# Patient Record
Sex: Male | Born: 2000 | Race: White | Hispanic: Yes | Marital: Single | Smoking: Never smoker
Health system: Southern US, Community
[De-identification: ages and names within clinical notes are randomized; demographics above are authoritative.]

## PROBLEM LIST (undated history)

## (undated) DIAGNOSIS — J45909 Unspecified asthma, uncomplicated: Secondary | ICD-10-CM

---
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
## 2020-03-12 ENCOUNTER — Ambulatory Visit: Payer: Medicaid Other | Attending: Internal Medicine

## 2020-03-12 DIAGNOSIS — Z23 Encounter for immunization: Secondary | ICD-10-CM

## 2020-03-12 NOTE — Progress Notes (Signed)
   Covid-19 Vaccination Clinic  Name:  James Fischer    MRN: [id number] DOB: 2001-07-23  03/12/2020  Mr. James Fischer was observed post Covid-19 immunization for 15 minutes without incident. He was provided with Vaccine Information Sheet and instruction to access the V-Safe system.   Mr. James Fischer was instructed to call 911 with any severe reactions post vaccine: Marland Kitchen Difficulty breathing  . Swelling of face and throat  . A fast heartbeat  . A bad rash all over body  . Dizziness and weakness   Immunizations Administered    Name Date Dose VIS Date Route   Pfizer COVID-19 Vaccine 03/12/2020  3:24 PM 0.3 mL 11/27/2018 Intramuscular   Manufacturer: [employer], [employer]   Lot: [id number]   NDC: [address]-1

## 2020-04-02 ENCOUNTER — Ambulatory Visit: Payer: Medicaid Other | Attending: Internal Medicine

## 2020-04-02 DIAGNOSIS — Z23 Encounter for immunization: Secondary | ICD-10-CM

## 2020-04-02 NOTE — Progress Notes (Signed)
   Covid-19 Vaccination Clinic  Name:  Chaske Paskett    MRN: [id number] DOB: Dec 25, 2000  04/02/2020  Mr. Henriquez-Garcia was observed post Covid-19 immunization for 15 minutes without incident. He was provided with Vaccine Information Sheet and instruction to access the V-Safe system.   Mr. Velna Hatchet was instructed to call 911 with any severe reactions post vaccine: Marland Kitchen Difficulty breathing  . Swelling of face and throat  . A fast heartbeat  . A bad rash all over body  . Dizziness and weakness   Immunizations Administered    Name Date Dose VIS Date Route   Pfizer COVID-19 Vaccine 04/02/2020  3:21 PM 0.3 mL 11/27/2018 Intramuscular   Manufacturer: [employer], [employer]   Lot: [id number]   NDC: [address]-9

## 2022-10-28 ENCOUNTER — Encounter: Payer: Self-pay | Admitting: Family Medicine

## 2022-10-28 ENCOUNTER — Ambulatory Visit (INDEPENDENT_AMBULATORY_CARE_PROVIDER_SITE_OTHER): Payer: Medicaid Other | Admitting: Family Medicine

## 2022-10-28 VITALS — BP 101/63 | HR 63 | Temp 97.0°F | Resp 16 | Ht 67.0 in | Wt 124.6 lb

## 2022-10-28 DIAGNOSIS — Z7689 Persons encountering health services in other specified circumstances: Secondary | ICD-10-CM

## 2022-10-28 DIAGNOSIS — Z789 Other specified health status: Secondary | ICD-10-CM | POA: Diagnosis not present

## 2022-10-28 NOTE — Progress Notes (Unsigned)
Patient is here to established care with provider today. Patient has many health concern they would like to discuss with provider today  Care gaps discuss at appointment today  

## 2022-10-28 NOTE — Progress Notes (Unsigned)
New Patient Office Visit  Subjective    Patient ID: James Fischer, male    DOB: 2000/11/08  Age: 22 y.o. MRN: [id number]  CC:  Chief Complaint  Patient presents with   Establish Care    HPI James Fischer presents to establish care and for complaint of speech deficits. He understands language but is not able to process. He did have therapies when he was younger but is unaware of any specific diagnosis.    Outpatient Encounter Medications as of 10/28/2022  Medication Sig   diphenhydrAMINE (BENADRYL) 25 MG tablet Take 2 tablets (50 mg total) by mouth every 6 (six) hours as needed for up to 3 days for itching.   fluocinonide (LIDEX) 0.05 % external solution Apply to itch, scaly areas on the scalp. Not to the face (Patient not taking: Reported on 10/28/2022)   hydrocortisone 1 % ointment Apply 1 application topically 2 (two) times daily. Apply to rash on neck. Do NOT use on face or genitals. (Patient not taking: Reported on 10/28/2022)   hydrocortisone cream 1 % Apply to affected area 2 times daily, apply to body. DO NOT use on face or genitals (Patient not taking: Reported on 10/28/2022)   ibuprofen (ADVIL,MOTRIN) 100 MG/5ML suspension Take 20 mLs (400 mg total) by mouth every 6 (six) hours as needed. (Patient not taking: Reported on 10/28/2022)   KETOCONAZOLE, TOPICAL, 1 % SHAM Apply 5 mLs topically daily. (Patient not taking: Reported on 10/28/2022)   ondansetron (ZOFRAN ODT) 4 MG disintegrating tablet Take 1 tablet (4 mg total) by mouth every 8 (eight) hours as needed for nausea or vomiting. (Patient not taking: Reported on 10/28/2022)   ondansetron (ZOFRAN) 4 MG tablet Take 1 tablet (4 mg total) by mouth every 6 (six) hours. (Patient not taking: Reported on 10/28/2022)   oseltamivir (TAMIFLU) 75 MG capsule Take 1 capsule (75 mg total) by mouth every 12 (twelve) hours. (Patient not taking: Reported on 10/28/2022)   No facility-administered encounter medications on file as of  10/28/2022.    Past Medical History:  Diagnosis Date   Asthma     No past surgical history on file.  No family history on file.  Social History   Socioeconomic History   Marital status: Single    Spouse name: Not on file   Number of children: Not on file   Years of education: Not on file   Highest education level: Not on file  Occupational History   Not on file  Tobacco Use   Smoking status: Never   Smokeless tobacco: Not on file  Substance and Sexual Activity   Alcohol use: Not on file   Drug use: Not on file   Sexual activity: Not on file  Other Topics Concern   Not on file  Social History Narrative   Not on file   Social Determinants of Health   Financial Resource Strain: Not on file  Food Insecurity: Not on file  Transportation Needs: Not on file  Physical Activity: Not on file  Stress: Not on file  Social Connections: Not on file  Intimate Partner Violence: Not on file    ROS      Objective    BP 101/63   Pulse 63   Temp (!) 97 F (36.1 C) (Temporal)   Resp 16   Ht 5\' 7"  (1.702 m)   Wt 124 lb 9.6 oz (56.5 kg)   SpO2 96%   BMI 19.52 kg/m   Physical Exam  {Labs (  Optional):23779}    Assessment & Plan:   Problem List Items Addressed This Visit   None Visit Diagnoses     Encounter to establish care    -  Primary   Language barrier to communication           No follow-ups on file.   Becky Sax, MD

## 2022-10-31 ENCOUNTER — Encounter: Payer: Self-pay | Admitting: Family Medicine

## 2022-11-08 ENCOUNTER — Ambulatory Visit: Payer: Medicaid Other | Attending: Family Medicine | Admitting: Speech Pathology

## 2022-11-08 ENCOUNTER — Encounter: Payer: Self-pay | Admitting: Speech Pathology

## 2022-11-08 DIAGNOSIS — Z7689 Persons encountering health services in other specified circumstances: Secondary | ICD-10-CM | POA: Diagnosis not present

## 2022-11-08 DIAGNOSIS — R4789 Other speech disturbances: Secondary | ICD-10-CM | POA: Diagnosis present

## 2022-11-08 NOTE — Therapy (Signed)
OUTPATIENT SPEECH LANGUAGE PATHOLOGY EVALUATION   Patient Name: James Fischer MRN: [id number] DOB:04-13-2001, 22 y.o., male Today's Date: 11/08/2022  PCP: Dorna Mai, MD REFERRING PROVIDER: Dorna Mai, MD  END OF SESSION:   Past Medical History:  Diagnosis Date   Asthma    History reviewed. No pertinent surgical history. Patient Active Problem List   Diagnosis Date Noted   Eczema 03/20/2013    ONSET DATE: Reportedly - 73 years of age is when mother noticed a delay  REFERRING DIAG: Z76.89 (ICD-10-CM) - Need for speech therapy assessment   THERAPY DIAG:  Cognitive communication deficit  Rationale for Evaluation and Treatment: Rehabilitation  SUBJECTIVE:   SUBJECTIVE STATEMENT: Pt was unable to provide much information independently. Required mother to assist with providing history.  Pt accompanied by: self and mother  and interpretor.   PERTINENT HISTORY: Unknown  PAIN:  Are you having pain? No  FALLS: Has patient fallen in last 6 months?  No  LIVING ENVIRONMENT: Lives with: lives with their family Lives in: House/apartment  PLOF:  Level of assistance: Independent with ADLs Employment: Other: Pt was working and was laid off because it is hard for him to understand. He had 3 jobs - but was laid off.   PATIENT GOALS: getting a job  OBJECTIVE:   DIAGNOSTIC FINDINGS: NA  COGNITION: Overall cognitive status: History of cognitive impairments - at baseline  COGNITIVE COMMUNICATION: Following directions: Follows multi-step commands inconsistently  Auditory comprehension: Impaired: Could not answer [place]- questions. Had difficulty answering simple biographical questions.  Verbal expression: Impaired: Interpretor reported difficulty understanding pt meaning.  Functional communication: Impaired: Mother reports pt difficulty keeping a job due to what she believes is due to difficulty understanding. Pt could not answer basic questions (other than  yes/no) without mod-max verbal prompting.    STANDARDIZED ASSESSMENTS: Portions of CLQT  -Basic questions: 8/9 required extra time and prompting to answer biographical questions -Story retell: Could not recall any information from story Informal assessment: Could not answer any [facility]- questions about self. SLP discontinued assessment as she suspects this is baseline for patient based off mother report.   PATIENT REPORTED OUTCOME MEASURES (PROM): NA    PATIENT EDUCATION: Education details: Resources Person educated: Patient and Parent Education method: Explanation Education comprehension: needs further education   GOALS: Goals reviewed with patient? No  SHORT TERM GOALS: Target date: 12/07/22  Pt mother will demonstrate understanding of therapist recommendations and resources for patient. Baseline: Goal status: INITIAL    ASSESSMENT:  CLINICAL IMPRESSION: Pt is a 22 yo male who presents to [facility] by referral from his PCP due to difficulty understanding. Pt was brought by his mother, whom he lives with. Reportedly, pt has had 3 jobs in the past but is unable to hold any jobs. Mother isn't quite sure why and pt was unable to tell "why"; however, mother suspects it is due to his inability to comprehend information. SLP completed clinical interview with mother. Mother reported pt participated in special education through high school and has completed several therapies. Mother was unable to provide SLP with a specific diagnosis; however, she did report he did not speak until he was 42-36 years of age (and only single words). She reported when he became school-aged, he was speaking in sentences. Pt is able to text in Spanish. The interpreter read text messages to mother aloud and reported some made sense and some did not. Pt was unable to provide any information independently. SLP observed difficulties with speech and  language during informal assessment. Pt was unable to comprehend or retain  information. He also was unable to verbally answer [facility] questions and appeared to be visibly stressed when trying to communicate. Pt was able to answer yes/no questions, though all of his answers were "no". SLP is limited in treating patient due to limited history (congenital vs. Acquired, hx of speech-language impairment etc.) Mother was unable to provide information from IEP.  SLP to see family for additional session to provide mother with resource support for intellectual disabilities ([facility]) for vocational and social support. SLP will also reach out to [facility] to see if they would be able to treat patient, as well as doctor for possible audiology referral to r/o auditory processing disorder.     OBJECTIVE IMPAIRMENTS: include  communication . These impairments are limiting patient from effectively communicating at home and in community. Factors affecting potential to achieve goals and functional outcome are previous level of function and severity of impairments.. Patient will benefit from skilled SLP services to address above impairments and improve overall function.  REHAB POTENTIAL: Poor Unable to determine nature of impairment. Limited resources.   PLAN:  SLP FREQUENCY:  1 additional visit.  SLP DURATION:  1 sessions  PLANNED INTERVENTIONS: Patient/family education    [place], [place] 11/08/2022, 12:35 PM

## 2022-11-15 NOTE — Addendum Note (Signed)
Addended by: Royston Sinner L on: 11/15/2022 09:00 AM   Modules accepted: Orders

## 2022-11-21 ENCOUNTER — Ambulatory Visit: Payer: Medicaid Other | Admitting: Speech Pathology

## 2022-11-21 DIAGNOSIS — R4789 Other speech disturbances: Secondary | ICD-10-CM | POA: Diagnosis not present

## 2022-11-22 NOTE — Therapy (Signed)
OUTPATIENT SPEECH LANGUAGE PATHOLOGY TREATMENT NOTE   Patient Name: James Fischer MRN: [id number] DOB:06/05/2001, 22 y.o., male Today's Date: 11/22/2022  PCP: Dorna Mai, MD REFERRING PROVIDER: Dorna Mai, MD  SPEECH THERAPY DISCHARGE SUMMARY  Visits from Start of Care: 2  Current functional level related to goals / functional outcomes: Met   Remaining deficits: Baseline impairments; were not addressed.    Education / Equipment: Completed and resources provided.    Patient agrees to discharge. Patient goals were met. Patient is being discharged due to meeting the stated rehab goals..     END OF SESSION:   End of Session - 11/22/22 1208     Visit Number 2    Number of Visits 2    SLP Start Time 1230    SLP Stop Time  [id number]    SLP Time Calculation (min) 25 min    Activity Tolerance Patient tolerated treatment well             Past Medical History:  Diagnosis Date   Asthma    No past surgical history on file. Patient Active Problem List   Diagnosis Date Noted   Eczema 03/20/2013    ONSET DATE: Reportedly - 62 years of age is when mother noticed a delay   REFERRING DIAG: Z76.89 (ICD-10-CM) - Need for speech therapy assessment   THERAPY DIAG:  Other speech disturbance  Rationale for Evaluation and Treatment: Habilitation  SUBJECTIVE:   SUBJECTIVE STATEMENT: "I don't know."  PAIN:  Are you having pain? No     OBJECTIVE:   TODAY'S TREATMENT:                                                                                                                                         DATE:  11/21/22: Patient and mom were seen for education session and discharge.  SLP reviewed discussion from evaluation regarding patient's impairments.  SLP provided the following recommendations re:  -Going to patient's previous school to inquire about his special education area of eligibility -SLP provided mother with script to provide office in [place]  and in [place] - [facility]   -Provided contact information  -Vocational Rehabilitation through the state of [place]  -Provided contact information -Interpretor recommended "[employer]" for immigrants/spanish speaking individuals who need assistance navigating situations  Mom reported understanding.  SLP encouraged mom to reach back out with any other questions.  SLP unable to make any other recommendations at this time, as patient mother does not know patient's original diagnosis and pt is at baseline.    SLP to discharge.   PATIENT EDUCATION: Education details: Resources Person educated: Patient and Parent Education method: Explanation Education comprehension: needs further education     GOALS: Goals reviewed with patient? No   SHORT TERM GOALS: Target date: 12/07/22   Pt mother will demonstrate understanding of therapist recommendations and resources for patient. Baseline:  Goal status: MET     ASSESSMENT:   CLINICAL IMPRESSION: Pt is a 22 yo male who presents [facility] by referral from his PCP due to difficulty understanding. Pt was brought by his mother, whom he lives with. Reportedly, pt has had 3 jobs in the past but is unable to hold any jobs. Mother isn't quite sure why and pt was unable to tell "why"; however, mother suspects it is due to his inability to comprehend information. SLP completed clinical interview with mother. Mother reported pt participated in special education through high school and has completed several therapies. Mother was unable to provide SLP with a specific diagnosis; however, she did report he did not speak until he was 35-36 years of age (and only single words). She reported when he became school-aged, he was speaking in sentences. Pt is able to text in Spanish. The interpreter read text messages to mother aloud and reported some made sense and some did not. Pt was unable to provide any information independently. SLP observed difficulties  with speech and language during informal assessment. Pt was unable to comprehend or retain information. He also was unable to verbally answer [facility] questions and appeared to be visibly stressed when trying to communicate. Pt was able to answer yes/no questions, though all of his answers were "no". SLP is limited in treating patient due to limited history (congenital vs. Acquired, hx of speech-language impairment etc.) Mother was unable to provide information from IEP.  SLP to see family for additional session to provide mother with resource support for intellectual disabilities ([facility] [place]) for vocational and social support. SLP will also reach out to [facility] to see if they would be able to treat patient, as well as doctor for possible audiology referral to r/o auditory processing disorder.       OBJECTIVE IMPAIRMENTS: include  communication . These impairments are limiting patient from effectively communicating at home and in community. Factors affecting potential to achieve goals and functional outcome are previous level of function and severity of impairments.. Patient will benefit from skilled SLP services to address above impairments and improve overall function.   REHAB POTENTIAL: Poor Unable to determine nature of impairment. Limited resources.    PLAN:   SLP FREQUENCY:  1 additional visit.   SLP DURATION:  1 sessions   PLANNED INTERVENTIONS: Patient/family education   [place], [place] 11/22/2022, 12:09 PM

## 2023-04-13 ENCOUNTER — Ambulatory Visit (INDEPENDENT_AMBULATORY_CARE_PROVIDER_SITE_OTHER): Payer: Medicaid Other | Admitting: Family Medicine

## 2023-04-13 ENCOUNTER — Encounter: Payer: Self-pay | Admitting: Family Medicine

## 2023-04-13 VITALS — BP 101/59 | HR 65 | Temp 98.0°F | Ht 67.5 in | Wt 125.8 lb

## 2023-04-13 DIAGNOSIS — R109 Unspecified abdominal pain: Secondary | ICD-10-CM

## 2023-04-13 DIAGNOSIS — Z789 Other specified health status: Secondary | ICD-10-CM

## 2023-04-13 MED ORDER — OMEPRAZOLE 40 MG PO CPDR: 40.0000 mg | DELAYED_RELEASE_CAPSULE | Freq: Every day | ORAL | 2 refills | Status: DC

## 2023-04-13 NOTE — Progress Notes (Signed)
Established Patient Office Visit  Subjective    Patient ID: James Fischer, male    DOB: 02-26-2001  Age: 22 y.o. MRN: [id number]  CC:  Chief Complaint  Patient presents with   Abdominal Pain    HPI James Fischer presents with mom with complaint of abdominal pain and using the bathroom a lot. He has a lot of gas. History was vague. This visit was aided by an interpreter.    Outpatient Encounter Medications as of 04/13/2023  Medication Sig   diphenhydrAMINE (BENADRYL) 25 MG tablet Take 2 tablets (50 mg total) by mouth every 6 (six) hours as needed for up to 3 days for itching.   fluocinonide (LIDEX) 0.05 % external solution Apply to itch, scaly areas on the scalp. Not to the face (Patient not taking: Reported on 10/28/2022)   hydrocortisone 1 % ointment Apply 1 application topically 2 (two) times daily. Apply to rash on neck. Do NOT use on face or genitals. (Patient not taking: Reported on 10/28/2022)   hydrocortisone cream 1 % Apply to affected area 2 times daily, apply to body. DO NOT use on face or genitals (Patient not taking: Reported on 10/28/2022)   ibuprofen (ADVIL,MOTRIN) 100 MG/5ML suspension Take 20 mLs (400 mg total) by mouth every 6 (six) hours as needed. (Patient not taking: Reported on 10/28/2022)   KETOCONAZOLE, TOPICAL, 1 % SHAM Apply 5 mLs topically daily. (Patient not taking: Reported on 10/28/2022)   ondansetron (ZOFRAN ODT) 4 MG disintegrating tablet Take 1 tablet (4 mg total) by mouth every 8 (eight) hours as needed for nausea or vomiting. (Patient not taking: Reported on 10/28/2022)   ondansetron (ZOFRAN) 4 MG tablet Take 1 tablet (4 mg total) by mouth every 6 (six) hours. (Patient not taking: Reported on 10/28/2022)   oseltamivir (TAMIFLU) 75 MG capsule Take 1 capsule (75 mg total) by mouth every 12 (twelve) hours. (Patient not taking: Reported on 10/28/2022)   No facility-administered encounter medications on file as of 04/13/2023.    Past Medical  History:  Diagnosis Date   Asthma     No past surgical history on file.  No family history on file.  Social History   Socioeconomic History   Marital status: Single    Spouse name: Not on file   Number of children: Not on file   Years of education: Not on file   Highest education level: Not on file  Occupational History   Not on file  Tobacco Use   Smoking status: Never   Smokeless tobacco: Not on file  Substance and Sexual Activity   Alcohol use: Not on file   Drug use: Not on file   Sexual activity: Not on file  Other Topics Concern   Not on file  Social History Narrative   Not on file   Social Determinants of Health   Financial Resource Strain: Not on file  Food Insecurity: Not on file  Transportation Needs: Not on file  Physical Activity: Not on file  Stress: Not on file  Social Connections: Not on file  Intimate Partner Violence: Not on file    Review of Systems  All other systems reviewed and are negative.       Objective    BP (!) 101/59   Pulse 65   Temp 98 F (36.7 C) (Oral)   Ht 5' 7.5" (1.715 m)   Wt 125 lb 12.8 oz (57.1 kg)   SpO2 96%   BMI 19.41 kg/m   Physical  Exam Vitals and nursing note reviewed.  Constitutional:      General: He is not in acute distress. Cardiovascular:     Rate and Rhythm: Normal rate and regular rhythm.  Pulmonary:     Effort: Pulmonary effort is normal.     Breath sounds: Normal breath sounds.  Neurological:     General: No focal deficit present.     Mental Status: He is alert.  Psychiatric:        Mood and Affect: Mood normal.        Behavior: Behavior normal.         Assessment & Plan:   1. Abdominal pain, unspecified abdominal location Omperazole prescribed.   2. Language barrier to communication     No follow-ups on file.   James Raymond, MD

## 2023-04-13 NOTE — Progress Notes (Signed)
Pt mom states Pt suffers from stomach. Has pain. Doesn't eat much but is using the restroom a lot.

## 2023-05-18 ENCOUNTER — Ambulatory Visit (INDEPENDENT_AMBULATORY_CARE_PROVIDER_SITE_OTHER): Payer: Medicaid Other | Admitting: Family Medicine

## 2023-05-18 ENCOUNTER — Encounter: Payer: Self-pay | Admitting: Family Medicine

## 2023-05-18 VITALS — BP 93/57 | HR 82 | Temp 98.1°F | Resp 16 | Wt 126.0 lb

## 2023-05-18 DIAGNOSIS — K30 Functional dyspepsia: Secondary | ICD-10-CM

## 2023-05-18 DIAGNOSIS — Z789 Other specified health status: Secondary | ICD-10-CM | POA: Diagnosis not present

## 2023-05-18 MED ORDER — DICYCLOMINE HCL 20 MG PO TABS: 20.0000 mg | ORAL_TABLET | Freq: Four times a day (QID) | ORAL | 0 refills | Status: AC

## 2023-05-18 NOTE — Progress Notes (Signed)
Patient mom says he has a very hard time expressing himself. Patient is still having stomach issues when he goes to the bathroom. Medication mom says do not work.

## 2023-05-22 ENCOUNTER — Encounter: Payer: Self-pay | Admitting: Family Medicine

## 2023-05-22 NOTE — Progress Notes (Signed)
Established Patient Office Visit  Subjective    Patient ID: James Fischer, male    DOB: 2000/12/16  Age: 22 y.o. MRN: [id number]  CC: No chief complaint on file.   HPI James Fischer presents with his mother with complaint of abdominal pain. Reports sxs are intermittent and may or may not be associated with eating. This patient is not a good historian and mom tried to give most of the information. This visit was also aided by an interpreter.    Outpatient Encounter Medications as of 05/18/2023  Medication Sig   dicyclomine (BENTYL) 20 MG tablet Take 1 tablet (20 mg total) by mouth every 6 (six) hours.   omeprazole (PRILOSEC) 40 MG capsule Take 1 capsule (40 mg total) by mouth daily.   diphenhydrAMINE (BENADRYL) 25 MG tablet Take 2 tablets (50 mg total) by mouth every 6 (six) hours as needed for up to 3 days for itching.   fluocinonide (LIDEX) 0.05 % external solution Apply to itch, scaly areas on the scalp. Not to the face (Patient not taking: Reported on 10/28/2022)   hydrocortisone 1 % ointment Apply 1 application topically 2 (two) times daily. Apply to rash on neck. Do NOT use on face or genitals. (Patient not taking: Reported on 10/28/2022)   hydrocortisone cream 1 % Apply to affected area 2 times daily, apply to body. DO NOT use on face or genitals (Patient not taking: Reported on 10/28/2022)   ibuprofen (ADVIL,MOTRIN) 100 MG/5ML suspension Take 20 mLs (400 mg total) by mouth every 6 (six) hours as needed. (Patient not taking: Reported on 10/28/2022)   KETOCONAZOLE, TOPICAL, 1 % SHAM Apply 5 mLs topically daily. (Patient not taking: Reported on 10/28/2022)   ondansetron (ZOFRAN ODT) 4 MG disintegrating tablet Take 1 tablet (4 mg total) by mouth every 8 (eight) hours as needed for nausea or vomiting. (Patient not taking: Reported on 10/28/2022)   ondansetron (ZOFRAN) 4 MG tablet Take 1 tablet (4 mg total) by mouth every 6 (six) hours. (Patient not taking: Reported on  10/28/2022)   oseltamivir (TAMIFLU) 75 MG capsule Take 1 capsule (75 mg total) by mouth every 12 (twelve) hours. (Patient not taking: Reported on 10/28/2022)   No facility-administered encounter medications on file as of 05/18/2023.    Past Medical History:  Diagnosis Date   Asthma     No past surgical history on file.  No family history on file.  Social History   Socioeconomic History   Marital status: Single    Spouse name: Not on file   Number of children: Not on file   Years of education: Not on file   Highest education level: Not on file  Occupational History   Not on file  Tobacco Use   Smoking status: Never   Smokeless tobacco: Not on file  Substance and Sexual Activity   Alcohol use: Not on file   Drug use: Not on file   Sexual activity: Not on file  Other Topics Concern   Not on file  Social History Narrative   Not on file   Social Determinants of Health   Financial Resource Strain: Not on file  Food Insecurity: Not on file  Transportation Needs: Not on file  Physical Activity: Not on file  Stress: Not on file  Social Connections: Not on file  Intimate Partner Violence: Not on file    Review of Systems  Gastrointestinal:  Positive for abdominal pain. Negative for constipation and diarrhea.  All other systems reviewed  and are negative.       Objective    BP (!) 93/57   Pulse 82   Temp 98.1 F (36.7 C) (Oral)   Resp 16   Wt 126 lb (57.2 kg)   SpO2 96%   BMI 19.44 kg/m   Physical Exam Vitals and nursing note reviewed.  Constitutional:      General: He is not in acute distress. Cardiovascular:     Rate and Rhythm: Normal rate and regular rhythm.  Pulmonary:     Effort: Pulmonary effort is normal.     Breath sounds: Normal breath sounds.  Abdominal:     Palpations: Abdomen is soft.     Tenderness: There is generalized abdominal tenderness.  Neurological:     General: No focal deficit present.     Mental Status: He is alert and  oriented to person, place, and time.         Assessment & Plan:   1. Functional dyspepsia Bentyl prescribed. Referral to GI for further eval/mgt - Ambulatory referral to Gastroenterology  2. Language barrier to communication     No follow-ups on file.   [name], MD

## 2023-05-30 ENCOUNTER — Encounter: Payer: Self-pay | Admitting: Gastroenterology

## 2023-07-18 ENCOUNTER — Ambulatory Visit (INDEPENDENT_AMBULATORY_CARE_PROVIDER_SITE_OTHER): Payer: Medicaid Other | Admitting: Family Medicine

## 2023-07-18 ENCOUNTER — Encounter: Payer: Self-pay | Admitting: Family Medicine

## 2023-07-18 VITALS — BP 102/64 | HR 69 | Temp 98.3°F | Resp 16 | Ht 67.0 in | Wt 124.8 lb

## 2023-07-18 DIAGNOSIS — K30 Functional dyspepsia: Secondary | ICD-10-CM | POA: Diagnosis not present

## 2023-07-18 DIAGNOSIS — Z789 Other specified health status: Secondary | ICD-10-CM | POA: Diagnosis not present

## 2023-07-18 MED ORDER — SUCRALFATE 1 G PO TABS: 1.0000 g | ORAL_TABLET | Freq: Three times a day (TID) | ORAL | 0 refills | Status: DC

## 2023-07-19 ENCOUNTER — Encounter: Payer: Self-pay | Admitting: Family Medicine

## 2023-07-19 NOTE — Progress Notes (Signed)
Established Patient Office Visit  Subjective    Patient ID: James Fischer, male    DOB: 2001/09/16  Age: 22 y.o. MRN: [id number]  CC:  Chief Complaint  Patient presents with   Follow-up    2 month      HPI James Fischer presents for follow up of stomach discomfort. Patient reports that his sx persist. This visit was aided by interpreter.   Outpatient Encounter Medications as of 07/18/2023  Medication Sig   dicyclomine (BENTYL) 20 MG tablet Take 1 tablet (20 mg total) by mouth every 6 (six) hours.   sucralfate (CARAFATE) 1 g tablet Take 1 tablet (1 g total) by mouth 4 (four) times daily -  with meals and at bedtime.   diphenhydrAMINE (BENADRYL) 25 MG tablet Take 2 tablets (50 mg total) by mouth every 6 (six) hours as needed for up to 3 days for itching.   fluocinonide (LIDEX) 0.05 % external solution Apply to itch, scaly areas on the scalp. Not to the face (Patient not taking: Reported on 10/28/2022)   hydrocortisone 1 % ointment Apply 1 application topically 2 (two) times daily. Apply to rash on neck. Do NOT use on face or genitals. (Patient not taking: Reported on 10/28/2022)   hydrocortisone cream 1 % Apply to affected area 2 times daily, apply to body. DO NOT use on face or genitals (Patient not taking: Reported on 10/28/2022)   ibuprofen (ADVIL,MOTRIN) 100 MG/5ML suspension Take 20 mLs (400 mg total) by mouth every 6 (six) hours as needed. (Patient not taking: Reported on 10/28/2022)   KETOCONAZOLE, TOPICAL, 1 % SHAM Apply 5 mLs topically daily. (Patient not taking: Reported on 10/28/2022)   omeprazole (PRILOSEC) 40 MG capsule Take 1 capsule (40 mg total) by mouth daily.   ondansetron (ZOFRAN ODT) 4 MG disintegrating tablet Take 1 tablet (4 mg total) by mouth every 8 (eight) hours as needed for nausea or vomiting. (Patient not taking: Reported on 10/28/2022)   ondansetron (ZOFRAN) 4 MG tablet Take 1 tablet (4 mg total) by mouth every 6 (six) hours. (Patient not  taking: Reported on 10/28/2022)   oseltamivir (TAMIFLU) 75 MG capsule Take 1 capsule (75 mg total) by mouth every 12 (twelve) hours. (Patient not taking: Reported on 10/28/2022)   No facility-administered encounter medications on file as of 07/18/2023.    Past Medical History:  Diagnosis Date   Asthma     History reviewed. No pertinent surgical history.  History reviewed. No pertinent family history.  Social History   Socioeconomic History   Marital status: Single    Spouse name: Not on file   Number of children: Not on file   Years of education: Not on file   Highest education level: Not on file  Occupational History   Not on file  Tobacco Use   Smoking status: Never   Smokeless tobacco: Not on file  Substance and Sexual Activity   Alcohol use: Not on file   Drug use: Not on file   Sexual activity: Not on file  Other Topics Concern   Not on file  Social History Narrative   Not on file   Social Determinants of Health   Financial Resource Strain: Low Risk  (07/18/2023)   Overall Financial Resource Strain (CARDIA)    Difficulty of Paying Living Expenses: Not hard at all  Food Insecurity: No Food Insecurity (07/18/2023)   Hunger Vital Sign    Worried About Running Out of Food in the Last Year: Never true  Ran Out of Food in the Last Year: Never true  Transportation Needs: No Transportation Needs (07/18/2023)   PRAPARE - Administrator, Civil Service (Medical): No    Lack of Transportation (Non-Medical): No  Physical Activity: Inactive (07/18/2023)   Exercise Vital Sign    Days of Exercise per Week: 0 days    Minutes of Exercise per Session: 0 min  Stress: No Stress Concern Present (07/18/2023)   [employer] of Occupational Health - Occupational Stress Questionnaire    Feeling of Stress : Not at all  Social Connections: Socially Isolated (07/18/2023)   Social Connection and Isolation Panel [NHANES]    Frequency of Communication with Friends and  Family: More than three times a week    Frequency of Social Gatherings with Friends and Family: More than three times a week    Attends Religious Services: Never    Database administrator or Organizations: No    Attends Banker Meetings: Never    Marital Status: Never married  Intimate Partner Violence: Not At Risk (07/18/2023)   Humiliation, Afraid, Rape, and Kick questionnaire    Fear of Current or Ex-Partner: No    Emotionally Abused: No    Physically Abused: No    Sexually Abused: No    Review of Systems  All other systems reviewed and are negative.       Objective    BP 102/64 (BP Location: Left Arm, Patient Position: Sitting, Cuff Size: Normal)   Pulse 69   Temp 98.3 F (36.8 C) (Oral)   Resp 16   Ht 5\' 7"  (1.702 m)   Wt 124 lb 12.8 oz (56.6 kg)   SpO2 96%   BMI 19.55 kg/m   Physical Exam Vitals and nursing note reviewed.  Constitutional:      General: He is not in acute distress. Cardiovascular:     Rate and Rhythm: Normal rate and regular rhythm.  Pulmonary:     Effort: Pulmonary effort is normal.     Breath sounds: Normal breath sounds.  Abdominal:     Palpations: Abdomen is soft.     Tenderness: There is generalized abdominal tenderness.  Neurological:     General: No focal deficit present.     Mental Status: He is alert and oriented to person, place, and time.         Assessment & Plan:   Functional dyspepsia  Language barrier to communication  Other orders -     Sucralfate; Take 1 tablet (1 g total) by mouth 4 (four) times daily -  with meals and at bedtime.  Dispense: 90 tablet; Refill: 0   Keep scheduled appt with GI  No follow-ups on file.   [name], MD

## 2023-08-11 ENCOUNTER — Other Ambulatory Visit (INDEPENDENT_AMBULATORY_CARE_PROVIDER_SITE_OTHER): Payer: Medicaid Other

## 2023-08-11 ENCOUNTER — Ambulatory Visit (INDEPENDENT_AMBULATORY_CARE_PROVIDER_SITE_OTHER): Payer: Medicaid Other | Admitting: Gastroenterology

## 2023-08-11 ENCOUNTER — Encounter: Payer: Self-pay | Admitting: Gastroenterology

## 2023-08-11 VITALS — BP 110/60 | HR 89 | Ht 67.0 in | Wt 124.4 lb

## 2023-08-11 DIAGNOSIS — R109 Unspecified abdominal pain: Secondary | ICD-10-CM | POA: Insufficient documentation

## 2023-08-11 DIAGNOSIS — R197 Diarrhea, unspecified: Secondary | ICD-10-CM

## 2023-08-11 DIAGNOSIS — R143 Flatulence: Secondary | ICD-10-CM | POA: Diagnosis not present

## 2023-08-11 LAB — COMPREHENSIVE METABOLIC PANEL
ALT: 13 U/L (ref 0–53)
AST: 13 U/L (ref 0–37)
Albumin: 4.8 g/dL (ref 3.5–5.2)
Alkaline Phosphatase: 87 U/L (ref 39–117)
BUN: 12 mg/dL (ref 6–23)
CO2: 32 meq/L (ref 19–32)
Calcium: 9.6 mg/dL (ref 8.4–10.5)
Chloride: 103 meq/L (ref 96–112)
Creatinine, Ser: 0.86 mg/dL (ref 0.40–1.50)
GFR: 123.38 mL/min (ref >60.00)
Glucose, Bld: 76 mg/dL (ref 70–99)
Potassium: 3.8 meq/L (ref 3.5–5.1)
Sodium: 141 meq/L (ref 135–145)
Total Bilirubin: 0.8 mg/dL (ref 0.2–1.2)
Total Protein: 7.4 g/dL (ref 6.0–8.3)

## 2023-08-11 LAB — CBC WITH DIFFERENTIAL/PLATELET
Basophils Absolute: 0 10*3/uL (ref 0.0–0.1)
Basophils Relative: 0.6 % (ref 0.0–3.0)
Eosinophils Absolute: 0.2 10*3/uL (ref 0.0–0.7)
Eosinophils Relative: 3.7 % (ref 0.0–5.0)
HCT: 41.1 % (ref 39.0–52.0)
Hemoglobin: 14 g/dL (ref 13.0–17.0)
Lymphocytes Relative: 31.1 % (ref 12.0–46.0)
Lymphs Abs: 2 10*3/uL (ref 0.7–4.0)
MCHC: 34.1 g/dL (ref 30.0–36.0)
MCV: 91.2 fL (ref 78.0–100.0)
Monocytes Absolute: 0.4 10*3/uL (ref 0.1–1.0)
Monocytes Relative: 6.3 % (ref 3.0–12.0)
Neutro Abs: 3.8 10*3/uL (ref 1.4–7.7)
Neutrophils Relative %: 58.3 % (ref 43.0–77.0)
Platelets: 198 10*3/uL (ref 150.0–400.0)
RBC: 4.5 Mil/uL (ref 4.22–5.81)
RDW: 13 % (ref 11.5–15.5)
WBC: 6.5 10*3/uL (ref 4.0–10.5)

## 2023-08-11 LAB — LIPASE: Lipase: 45 U/L (ref 11.0–59.0)

## 2023-08-11 LAB — TSH: TSH: 1.18 u[IU]/mL (ref 0.35–5.50)

## 2023-08-11 LAB — C-REACTIVE PROTEIN: CRP: <1 mg/dL (ref 0.5–20.0)

## 2023-08-11 LAB — SEDIMENTATION RATE: Sed Rate: 5 mm/h (ref 0–15)

## 2023-08-11 NOTE — Progress Notes (Signed)
08/11/2023 Janee Morn [id number] September 07, 2001   HISTORY OF PRESENT ILLNESS:  This is a 22 year old male here for GI symptoms, referred by Dr. Andrey Campanile for "functional dyspepsia".  Patient and his mother are primarily Spanish-speaking so interpreter was present for the entire visit.  There was significant communication barriers.  Patient provided very little history, apparently cannot speak well and has a hard time expressing himself according to the mother so she provided most of the history.  She reports that he complains of abdominal pain daily particularly after eating.  Says that he has a lot of gas.  He cannot really describe his stools, but she thinks that they are often diarrhea.  All of this has been present for about the past year.  Says that he has always kind of spent a long time in the bathroom.  Seems to be getting worse.  He does deny any rectal bleeding.  He had a difficult time telling me where his pain is.  Says that she tried to change his diet, decreasing the fat and making it more healthy.  PCP has tried some different medications including omeprazole 40 mg daily and Carafate 4 times daily, which he is currently on.  He had also been given dicyclomine with no apparent improvement.  They have not done any evaluation.     Past Medical History:  Diagnosis Date   Asthma    History reviewed. No pertinent surgical history.  reports that he has never smoked. He does not have any smokeless tobacco history on file. He reports that he does not drink alcohol and does not use drugs. family history is not on file. No Known Allergies    Outpatient Encounter Medications as of 08/11/2023  Medication Sig   dicyclomine (BENTYL) 20 MG tablet Take 1 tablet (20 mg total) by mouth every 6 (six) hours.   fluocinonide (LIDEX) 0.05 % external solution    hydrocortisone 1 % ointment Apply 1 application topically 2 (two) times daily. Apply to rash on neck. Do NOT use on face or  genitals.   hydrocortisone cream 1 % Apply to affected area 2 times daily, apply to body. DO NOT use on face or genitals   ibuprofen (ADVIL,MOTRIN) 100 MG/5ML suspension Take 20 mLs (400 mg total) by mouth every 6 (six) hours as needed.   KETOCONAZOLE, TOPICAL, 1 % SHAM Apply 5 mLs topically daily.   omeprazole (PRILOSEC) 40 MG capsule Take 1 capsule (40 mg total) by mouth daily.   ondansetron (ZOFRAN ODT) 4 MG disintegrating tablet Take 1 tablet (4 mg total) by mouth every 8 (eight) hours as needed for nausea or vomiting.   ondansetron (ZOFRAN) 4 MG tablet Take 1 tablet (4 mg total) by mouth every 6 (six) hours.   oseltamivir (TAMIFLU) 75 MG capsule Take 1 capsule (75 mg total) by mouth every 12 (twelve) hours.   sucralfate (CARAFATE) 1 g tablet Take 1 tablet (1 g total) by mouth 4 (four) times daily -  with meals and at bedtime.   diphenhydrAMINE (BENADRYL) 25 MG tablet Take 2 tablets (50 mg total) by mouth every 6 (six) hours as needed for up to 3 days for itching.   No facility-administered encounter medications on file as of 08/11/2023.    REVIEW OF SYSTEMS  : All other systems reviewed and negative except where noted in the History of Present Illness.   PHYSICAL EXAM: BP 110/60 (BP Location: Left Arm, Patient Position: Sitting, Cuff Size: Normal)   Pulse  89   Ht 5\' 7"  (1.702 m)   Wt 124 lb 6 oz (56.4 kg)   SpO2 98%   BMI 19.48 kg/m  General: Well developed male in no acute distress Head: Normocephalic and atraumatic Eyes:  Sclerae anicteric, conjunctiva pink. Ears: Normal auditory acuity Lungs: Clear throughout to auscultation; no W/R/R. Heart: Regular rate and rhythm; no M/R/G. Abdomen: Soft, non-distended.  BS present.  Non-tender. Musculoskeletal: Symmetrical with no gross deformities  Skin: No lesions on visible extremities Extremities: No edema  Neurological: Alert oriented x 4, grossly non-focal Psychological:  Alert and cooperative. Normal mood and affect  ASSESSMENT  AND PLAN: 22 year old male here for GI symptoms.  Patient and his mother are primarily Spanish-speaking so interpreter was present for the entire visit.  There was significant communication barriers.  Patient provided very little history, apparently cannot speak well and has a hard time expressing himself according to the mother.  She reports that he complains of abdominal pain daily particularly after eating.  Says that he has a lot of gas.  He cannot really describe his stools, but she thinks that they are often diarrhea.  He does deny any rectal bleeding.  He had a difficult time telling me where his pain is.  PCP has tried some different medications including omeprazole 40 mg daily and Carafate 4 times daily, which he is currently on.  He had also been given dicyclomine with no apparent improvement.  They have not done any evaluation.  Will start with labs including a CBC, CMP, lipase, TSH, sed rate, CRP, celiac labs.  Will check a Diatherix for H. pylori and GI pathogen panel.  Will also check a fecal calprotectin.  Will schedule for a CT scan of the abdomen and pelvis with contrast.  May need EGD and colonoscopy as well but will start here for now.   CC:  Georganna Skeans, MD

## 2023-08-11 NOTE — Progress Notes (Signed)
Attending Physician's Attestation   I have reviewed the chart.   I agree with the Advanced Practitioner's note, impression, and recommendations with any updates as below.    Emersyn Wyss Mansouraty, MD [facility] Office # [phone]  

## 2023-08-11 NOTE — Patient Instructions (Addendum)
Su proveedor le ha solicitado que vaya al stano para Education officer, environmental anlisis de laboratorio antes de irse hoy. Presione "B" en el ascensor. El laboratorio est ubicado en la primera puerta a la izquierda al salir del Medical sales representative.  Su proveedor le ha ordenado una prueba de heces con "Diatherix". Usted recibi [employer] un kit de nuestra oficina que contiene todos los suministros necesarios para completar esta prueba. Lea atentamente las instrucciones de recoleccin de heces proporcionadas en el kit antes de abrir los materiales que lo acompaan. Adems, asegrese de Chemical engineer de la esquina superior derecha de la hoja de solicitud del laboratorio en el tubo "puritan opti-swab" que se suministra con Probation officer. Esta etiqueta debe incluir su nombre completo y fecha de nacimiento. Despus de completar la prueba, debe asegurar el tubo Purtian en la bolsa para muestras de riesgo biolgico. La hoja de informacin de solicitud del laboratorio (que incluye la fecha y hora de la recoleccin de la Leisure centre manager) debe colocarse en el bolsillo exterior de la bolsa de muestras de riesgo biolgico y Database administrator al laboratorio de Adult nurse dentro de los 2 das de Financial trader.  Si no se completa la fecha y hora de la muestra en la hoja de informacin del laboratorio, NO se realizar la prueba.  Se le ha programado una tomografa computarizada del abdomen y la pelvis en el primer piso de [facility] del [facility]. Ests programado para el viernes 15/11/24 a las 16:30 horas.  Llegue a las 2:15 pm para registrarse y Air cabin crew oral.  Siga las instrucciones escritas a Sport and exercise psychologist de su examen:   1) No comas nada despus de las 12:30 p.m. (4 horas antes de tu prueba)    Si tiene alguna pregunta sobre su examen o si necesita reprogramarlo, puede llamar a [facility] al [phone] [place] 8:00 am y las 5:00 pm, de lunes a viernes.    Your provider has requested that you go to the basement level for  lab work before leaving today. Press "B" on the elevator. The lab is located at the first door on the left as you exit the elevator.  Your provider has ordered "Diatherix" stool testing for you. You have received a kit from our office today containing all necessary supplies to complete this test. Please carefully read the stool collection instructions provided in the kit before opening the accompanying materials. In addition, be sure to place the label from the top right corner of the laboratory request sheet onto the "puritan opti-swab" tube that is supplied in the kit. This label should include your full name and date of birth. After completing the test, you should secure the purtian tube into the specimen biohazard bag. The laboratory request information sheet (including date and time of specimen collection) should be placed into the outside pocket of the specimen biohazard bag and returned to the [facility] lab with 2 days of collection.   If the laboratory information sheet specimen date and time are not filled out, the test will NOT be performed.  You have been scheduled for a CT scan of the abdomen and pelvis at [facility], 1st floor Radiology. You are scheduled on Friday 08/18/23 at 4:30 pm.  Please arrive at 2:15 pm for registration and to drink oral contrast.  Please follow the written instructions below on the day of your exam:   1) Do not eat anything after 12:30 pm (4 hours prior to your test)    If you have  any questions regarding your exam or if you need to reschedule, you may call [facility] at [phone] between the hours of 8:00 am and 5:00 pm, Monday-Friday.

## 2023-08-12 LAB — TISSUE TRANSGLUTAMINASE ABS,IGG,IGA
(tTG) Ab, IgA: <1 U/mL
(tTG) Ab, IgG: <1 U/mL

## 2023-08-12 LAB — IGA: Immunoglobulin A: 269 mg/dL (ref 47–310)

## 2023-08-18 ENCOUNTER — Other Ambulatory Visit: Payer: Medicaid Other

## 2023-08-18 ENCOUNTER — Ambulatory Visit (HOSPITAL_COMMUNITY)
Admission: RE | Admit: 2023-08-18 | Discharge: 2023-08-18 | Disposition: A | Discharge status: Home / Self Care | Payer: Medicaid Other | Source: Ambulatory Visit | Attending: Gastroenterology | Admitting: Gastroenterology

## 2023-08-18 DIAGNOSIS — R143 Flatulence: Secondary | ICD-10-CM

## 2023-08-18 DIAGNOSIS — R197 Diarrhea, unspecified: Secondary | ICD-10-CM | POA: Insufficient documentation

## 2023-08-18 DIAGNOSIS — R109 Unspecified abdominal pain: Secondary | ICD-10-CM | POA: Insufficient documentation

## 2023-08-18 MED ORDER — IOHEXOL 300 MG/ML  SOLN
100.0000 mL | Freq: Once | INTRAMUSCULAR | Status: AC | PRN
  Administered 2023-08-18: 100 mL via INTRAVENOUS

## 2023-08-18 MED ORDER — IOHEXOL 300 MG/ML  SOLN
30.0000 mL | Freq: Once | INTRAMUSCULAR | Status: AC | PRN
  Administered 2023-08-18: 30 mL via ORAL

## 2023-08-18 MED ORDER — IOHEXOL 9 MG/ML PO SOLN: 1000.0000 mL | ORAL | Status: DC

## 2023-08-22 ENCOUNTER — Telehealth: Payer: Self-pay | Admitting: Gastroenterology

## 2023-08-22 LAB — CALPROTECTIN, FECAL: Calprotectin, Fecal: 11 ug/g (ref 0–120)

## 2023-08-22 NOTE — Telephone Encounter (Signed)
See results note dated 11/18

## 2023-08-22 NOTE — Telephone Encounter (Signed)
Inbound call from patient's mother, calling back in regards to CT results.

## 2023-08-23 NOTE — Telephone Encounter (Signed)
Patient's mother, Vale Haven (ok on ROI) has been advised of normal stool testing. Advised that Shanda Bumps feels symptoms are related to constipation and we would like patient to follow up with the bowel purge and miralax daily thereafter. Humberta verbalizes understanding and denies any additional questions.

## 2023-08-23 NOTE — Telephone Encounter (Signed)
Patient mother is return your call. Please advise.

## 2023-10-18 ENCOUNTER — Ambulatory Visit: Payer: Medicaid Other | Admitting: Family Medicine

## 2023-11-08 ENCOUNTER — Encounter: Payer: Self-pay | Admitting: Family Medicine

## 2023-11-08 ENCOUNTER — Ambulatory Visit (INDEPENDENT_AMBULATORY_CARE_PROVIDER_SITE_OTHER): Payer: Medicaid Other | Admitting: Family Medicine

## 2023-11-08 VITALS — BP 111/72 | HR 72 | Temp 97.4°F | Resp 16 | Ht 67.5 in | Wt 126.2 lb

## 2023-11-08 DIAGNOSIS — R21 Rash and other nonspecific skin eruption: Secondary | ICD-10-CM | POA: Diagnosis not present

## 2023-11-08 DIAGNOSIS — K30 Functional dyspepsia: Secondary | ICD-10-CM

## 2023-11-08 MED ORDER — OMEPRAZOLE 40 MG PO CPDR: 40.0000 mg | DELAYED_RELEASE_CAPSULE | Freq: Every day | ORAL | 1 refills | Status: AC

## 2023-11-08 MED ORDER — TRIAMCINOLONE ACETONIDE 0.1 % EX CREA: 1.0000 | TOPICAL_CREAM | Freq: Two times a day (BID) | CUTANEOUS | 2 refills | Status: AC

## 2023-11-08 MED ORDER — SUCRALFATE 1 G PO TABS: 1.0000 g | ORAL_TABLET | Freq: Three times a day (TID) | ORAL | 5 refills | Status: AC

## 2023-11-10 ENCOUNTER — Encounter: Payer: Self-pay | Admitting: Family Medicine

## 2023-11-10 NOTE — Progress Notes (Signed)
 Established Patient Office Visit  Subjective    Patient ID: James Fischer, male    DOB: 04/14/2001  Age: 23 y.o. MRN: [id number]  CC:  Chief Complaint  Patient presents with   Follow-up    3 month    HPI Wolfe Camarena presents for follow up of abdominal pain. Patient reports that he is doing well with present management. He also reports a rash that is intermittently itchy.   Outpatient Encounter Medications as of 11/08/2023  Medication Sig   dicyclomine  (BENTYL ) 20 MG tablet Take 1 tablet (20 mg total) by mouth every 6 (six) hours.   fluocinonide (LIDEX) 0.05 % external solution    hydrocortisone  1 % ointment Apply 1 application topically 2 (two) times daily. Apply to rash on neck. Do NOT use on face or genitals.   hydrocortisone  cream 1 % Apply to affected area 2 times daily, apply to body. DO NOT use on face or genitals   ibuprofen  (ADVIL ,MOTRIN ) 100 MG/5ML suspension Take 20 mLs (400 mg total) by mouth every 6 (six) hours as needed.   KETOCONAZOLE , TOPICAL, 1 % SHAM Apply 5 mLs topically daily.   ondansetron  (ZOFRAN  ODT) 4 MG disintegrating tablet Take 1 tablet (4 mg total) by mouth every 8 (eight) hours as needed for nausea or vomiting.   ondansetron  (ZOFRAN ) 4 MG tablet Take 1 tablet (4 mg total) by mouth every 6 (six) hours.   oseltamivir  (TAMIFLU ) 75 MG capsule Take 1 capsule (75 mg total) by mouth every 12 (twelve) hours.   triamcinolone  cream (KENALOG ) 0.1 % Apply 1 Application topically 2 (two) times daily.   [DISCONTINUED] omeprazole  (PRILOSEC) 40 MG capsule Take 1 capsule (40 mg total) by mouth daily.   [DISCONTINUED] sucralfate  (CARAFATE ) 1 g tablet Take 1 tablet (1 g total) by mouth 4 (four) times daily -  with meals and at bedtime.   diphenhydrAMINE  (BENADRYL ) 25 MG tablet Take 2 tablets (50 mg total) by mouth every 6 (six) hours as needed for up to 3 days for itching.   omeprazole  (PRILOSEC) 40 MG capsule Take 1 capsule (40 mg total) by mouth daily.    sucralfate  (CARAFATE ) 1 g tablet Take 1 tablet (1 g total) by mouth 4 (four) times daily -  with meals and at bedtime.   No facility-administered encounter medications on file as of 11/08/2023.    Past Medical History:  Diagnosis Date   Asthma     History reviewed. No pertinent surgical history.  History reviewed. No pertinent family history.  Social History   Socioeconomic History   Marital status: Single    Spouse name: Not on file   Number of children: Not on file   Years of education: Not on file   Highest education level: Not on file  Occupational History   Not on file  Tobacco Use   Smoking status: Never   Smokeless tobacco: Not on file  Vaping Use   Vaping status: Never Used  Substance and Sexual Activity   Alcohol use: Never   Drug use: Never   Sexual activity: Not on file  Other Topics Concern   Not on file  Social History Narrative   Not on file   Social Drivers of Health   Financial Resource Strain: Low Risk  (07/18/2023)   Overall Financial Resource Strain (CARDIA)    Difficulty of Paying Living Expenses: Not hard at all  Food Insecurity: No Food Insecurity (07/18/2023)   Hunger Vital Sign    Worried About  Running Out of Food in the Last Year: Never true    Ran Out of Food in the Last Year: Never true  Transportation Needs: No Transportation Needs (07/18/2023)   PRAPARE - Administrator, Civil Service (Medical): No    Lack of Transportation (Non-Medical): No  Physical Activity: Inactive (07/18/2023)   Exercise Vital Sign    Days of Exercise per Week: 0 days    Minutes of Exercise per Session: 0 min  Stress: No Stress Concern Present (07/18/2023)   [employer] of Occupational Health - Occupational Stress Questionnaire    Feeling of Stress : Not at all  Social Connections: Socially Isolated (07/18/2023)   Social Connection and Isolation Panel [NHANES]    Frequency of Communication with Friends and Family: More than three times a  week    Frequency of Social Gatherings with Friends and Family: More than three times a week    Attends Religious Services: Never    Database Administrator or Organizations: No    Attends Banker Meetings: Never    Marital Status: Never married  Intimate Partner Violence: Not At Risk (07/18/2023)   Humiliation, Afraid, Rape, and Kick questionnaire    Fear of Current or Ex-Partner: No    Emotionally Abused: No    Physically Abused: No    Sexually Abused: No    Review of Systems  All other systems reviewed and are negative.       Objective    BP 111/72 (BP Location: Right Arm, Patient Position: Sitting, Cuff Size: Small)   Pulse 72   Temp (!) 97.4 F (36.3 C) (Oral)   Resp 16   Ht 5' 7.5 (1.715 m)   Wt 126 lb 3.2 oz (57.2 kg)   SpO2 99%   BMI 19.47 kg/m   Physical Exam Vitals and nursing note reviewed.  Constitutional:      General: He is not in acute distress. Cardiovascular:     Rate and Rhythm: Normal rate and regular rhythm.  Pulmonary:     Effort: Pulmonary effort is normal.     Breath sounds: Normal breath sounds.  Abdominal:     Palpations: Abdomen is soft.     Tenderness: There is no abdominal tenderness.  Skin:    General: Skin is warm and dry.     Findings: Rash present.  Neurological:     General: No focal deficit present.     Mental Status: He is alert and oriented to person, place, and time.         Assessment & Plan:  1. Functional dyspepsia (Primary) Improved with present management. Prilosec and carafate  refilled.   2. Rash and nonspecific skin eruption Triamcinolone  cream prescribed.   Return in about 6 months (around 05/07/2024) for follow up, chronic med issues.   Tanda Raguel SQUIBB, MD

## 2024-01-29 ENCOUNTER — Encounter: Payer: Self-pay | Admitting: Gastroenterology

## 2024-05-07 ENCOUNTER — Encounter: Payer: Self-pay | Admitting: Family Medicine

## 2024-05-07 ENCOUNTER — Ambulatory Visit (INDEPENDENT_AMBULATORY_CARE_PROVIDER_SITE_OTHER): Admitting: Family Medicine

## 2024-05-07 ENCOUNTER — Ambulatory Visit: Payer: Medicaid Other | Admitting: Family Medicine

## 2024-05-07 VITALS — BP 98/64 | HR 63 | Ht 67.5 in | Wt 130.0 lb

## 2024-05-07 DIAGNOSIS — Z789 Other specified health status: Secondary | ICD-10-CM

## 2024-05-07 DIAGNOSIS — K30 Functional dyspepsia: Secondary | ICD-10-CM

## 2024-05-07 DIAGNOSIS — K5900 Constipation, unspecified: Secondary | ICD-10-CM | POA: Diagnosis not present

## 2024-05-07 MED ORDER — POLYETHYLENE GLYCOL 3350 17 GM/SCOOP PO POWD: 17.0000 g | Freq: Two times a day (BID) | ORAL | 1 refills | Status: AC | PRN

## 2024-05-07 NOTE — Progress Notes (Signed)
 Established Patient Office Visit  Subjective    Patient ID: James Fischer, male    DOB: 09-25-2001  Age: 23 y.o. MRN: [id number]  CC:  Chief Complaint  Patient presents with   Medical Management of Chronic Issues    Pt reports still having pain everyday in his stomach     HPI James Fischer presents for continued GI symptoms that are not significantly improved with present management. Patient also is not able to have consistent BM and passes mostly air. This visit was aided by an interpreter.   Outpatient Encounter Medications as of 05/07/2024  Medication Sig   dicyclomine  (BENTYL ) 20 MG tablet Take 1 tablet (20 mg total) by mouth every 6 (six) hours.   diphenhydrAMINE  (BENADRYL ) 25 MG tablet Take 2 tablets (50 mg total) by mouth every 6 (six) hours as needed for up to 3 days for itching.   fluocinonide (LIDEX) 0.05 % external solution    hydrocortisone  1 % ointment Apply 1 application topically 2 (two) times daily. Apply to rash on neck. Do NOT use on face or genitals.   hydrocortisone  cream 1 % Apply to affected area 2 times daily, apply to body. DO NOT use on face or genitals   ibuprofen  (ADVIL ,MOTRIN ) 100 MG/5ML suspension Take 20 mLs (400 mg total) by mouth every 6 (six) hours as needed.   KETOCONAZOLE , TOPICAL, 1 % SHAM Apply 5 mLs topically daily.   omeprazole  (PRILOSEC) 40 MG capsule Take 1 capsule (40 mg total) by mouth daily.   ondansetron  (ZOFRAN  ODT) 4 MG disintegrating tablet Take 1 tablet (4 mg total) by mouth every 8 (eight) hours as needed for nausea or vomiting.   ondansetron  (ZOFRAN ) 4 MG tablet Take 1 tablet (4 mg total) by mouth every 6 (six) hours.   oseltamivir  (TAMIFLU ) 75 MG capsule Take 1 capsule (75 mg total) by mouth every 12 (twelve) hours.   polyethylene glycol powder (GLYCOLAX /MIRALAX ) 17 GM/SCOOP powder Take 17 g by mouth 2 (two) times daily as needed.   sucralfate  (CARAFATE ) 1 g tablet Take 1 tablet (1 g total) by mouth 4 (four) times  daily -  with meals and at bedtime.   triamcinolone  cream (KENALOG ) 0.1 % Apply 1 Application topically 2 (two) times daily.   No facility-administered encounter medications on file as of 05/07/2024.    Past Medical History:  Diagnosis Date   Asthma     History reviewed. No pertinent surgical history.  History reviewed. No pertinent family history.  Social History   Socioeconomic History   Marital status: Single    Spouse name: Not on file   Number of children: Not on file   Years of education: Not on file   Highest education level: Not on file  Occupational History   Not on file  Tobacco Use   Smoking status: Never   Smokeless tobacco: Not on file  Vaping Use   Vaping status: Never Used  Substance and Sexual Activity   Alcohol use: Never   Drug use: Never   Sexual activity: Not on file  Other Topics Concern   Not on file  Social History Narrative   Not on file   Social Drivers of Health   Financial Resource Strain: Low Risk  (07/18/2023)   Overall Financial Resource Strain (CARDIA)    Difficulty of Paying Living Expenses: Not hard at all  Food Insecurity: No Food Insecurity (07/18/2023)   Hunger Vital Sign    Worried About Programme researcher, broadcasting/film/video in  the Last Year: Never true    Ran Out of Food in the Last Year: Never true  Transportation Needs: No Transportation Needs (07/18/2023)   PRAPARE - Administrator, Civil Service (Medical): No    Lack of Transportation (Non-Medical): No  Physical Activity: Inactive (07/18/2023)   Exercise Vital Sign    Days of Exercise per Week: 0 days    Minutes of Exercise per Session: 0 min  Stress: No Stress Concern Present (07/18/2023)   [employer] of Occupational Health - Occupational Stress Questionnaire    Feeling of Stress : Not at all  Social Connections: Socially Isolated (07/18/2023)   Social Connection and Isolation Panel    Frequency of Communication with Friends and Family: More than three times a week     Frequency of Social Gatherings with Friends and Family: More than three times a week    Attends Religious Services: Never    Database administrator or Organizations: No    Attends Banker Meetings: Never    Marital Status: Never married  Intimate Partner Violence: Not At Risk (07/18/2023)   Humiliation, Afraid, Rape, and Kick questionnaire    Fear of Current or Ex-Partner: No    Emotionally Abused: No    Physically Abused: No    Sexually Abused: No    Review of Systems  Gastrointestinal:  Positive for constipation. Negative for blood in stool.  All other systems reviewed and are negative.       Objective    BP 98/64   Pulse 63   Ht 5' 7.5 (1.715 m)   Wt 130 lb (59 kg)   SpO2 99%   BMI 20.06 kg/m   Physical Exam Vitals and nursing note reviewed.  Constitutional:      General: He is not in acute distress. Cardiovascular:     Rate and Rhythm: Normal rate and regular rhythm.  Pulmonary:     Effort: Pulmonary effort is normal.     Breath sounds: Normal breath sounds.  Abdominal:     Palpations: Abdomen is soft.     Tenderness: There is no abdominal tenderness.  Skin:    General: Skin is warm and dry.     Findings: Rash present.  Neurological:     General: No focal deficit present.     Mental Status: He is alert and oriented to person, place, and time.         Assessment & Plan:   Functional dyspepsia -     Ambulatory referral to Gastroenterology  Constipation, unspecified constipation type -     Ambulatory referral to Gastroenterology  Language barrier to communication  Other orders -     Polyethylene Glycol 3350 ; Take 17 g by mouth 2 (two) times daily as needed.  Dispense: 3350 g; Refill: 1     No follow-ups on file.   Tanda Raguel SQUIBB, MD
